# Patient Record
Sex: Male | Born: 1970 | Race: Asian | Hispanic: No | Marital: Married
Health system: Southern US, Community
[De-identification: ages and names within clinical notes are randomized; demographics above are authoritative.]

---

## 2021-04-08 ENCOUNTER — Emergency Department (HOSPITAL_COMMUNITY): Payer: No Typology Code available for payment source

## 2021-04-08 ENCOUNTER — Emergency Department (HOSPITAL_COMMUNITY)
Admission: EM | Admit: 2021-04-08 | Discharge: 2021-04-08 | Disposition: A | Discharge status: Home / Self Care | Payer: No Typology Code available for payment source | Attending: Emergency Medicine | Admitting: Emergency Medicine

## 2021-04-08 ENCOUNTER — Other Ambulatory Visit: Payer: Self-pay

## 2021-04-08 DIAGNOSIS — S6992XA Unspecified injury of left wrist, hand and finger(s), initial encounter: Secondary | ICD-10-CM | POA: Diagnosis present

## 2021-04-08 DIAGNOSIS — S80812A Abrasion, left lower leg, initial encounter: Secondary | ICD-10-CM | POA: Diagnosis not present

## 2021-04-08 DIAGNOSIS — S60222A Contusion of left hand, initial encounter: Secondary | ICD-10-CM | POA: Diagnosis not present

## 2021-04-08 DIAGNOSIS — Y92410 Unspecified street and highway as the place of occurrence of the external cause: Secondary | ICD-10-CM | POA: Insufficient documentation

## 2021-04-08 DIAGNOSIS — S0990XA Unspecified injury of head, initial encounter: Secondary | ICD-10-CM | POA: Diagnosis not present

## 2021-04-08 DIAGNOSIS — M542 Cervicalgia: Secondary | ICD-10-CM | POA: Diagnosis not present

## 2021-04-08 DIAGNOSIS — S8012XA Contusion of left lower leg, initial encounter: Secondary | ICD-10-CM | POA: Diagnosis not present

## 2021-04-08 DIAGNOSIS — R52 Pain, unspecified: Secondary | ICD-10-CM

## 2021-04-08 DIAGNOSIS — S40012A Contusion of left shoulder, initial encounter: Secondary | ICD-10-CM | POA: Diagnosis not present

## 2021-04-08 MED ORDER — CYCLOBENZAPRINE HCL 10 MG PO TABS: 10.0000 mg | ORAL_TABLET | Freq: Two times a day (BID) | ORAL | 0 refills | Status: AC | PRN

## 2021-04-08 MED ORDER — ACETAMINOPHEN 500 MG PO TABS
1000.0000 mg | ORAL_TABLET | Freq: Once | ORAL | Status: AC
  Administered 2021-04-08: 1000 mg via ORAL
  Filled 2021-04-08: qty 2
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
